# Patient Record
Sex: Male | Born: 1985 | Race: Black or African American | Hispanic: No | Marital: Single | Smoking: Never smoker
Health system: Southern US, Community
[De-identification: ages and names within clinical notes are randomized; demographics above are authoritative.]

---

## 2016-07-12 ENCOUNTER — Emergency Department: Payer: BC Managed Care – PPO

## 2016-07-12 ENCOUNTER — Encounter: Payer: Self-pay | Admitting: Emergency Medicine

## 2016-07-12 ENCOUNTER — Emergency Department
Admission: EM | Admit: 2016-07-12 | Discharge: 2016-07-12 | Disposition: A | Discharge status: Home / Self Care | Payer: BC Managed Care – PPO | Attending: Emergency Medicine | Admitting: Emergency Medicine

## 2016-07-12 DIAGNOSIS — Y939 Activity, unspecified: Secondary | ICD-10-CM | POA: Diagnosis not present

## 2016-07-12 DIAGNOSIS — S63114A Dislocation of metacarpophalangeal joint of right thumb, initial encounter: Secondary | ICD-10-CM | POA: Insufficient documentation

## 2016-07-12 DIAGNOSIS — Y929 Unspecified place or not applicable: Secondary | ICD-10-CM | POA: Diagnosis not present

## 2016-07-12 DIAGNOSIS — W010XXA Fall on same level from slipping, tripping and stumbling without subsequent striking against object, initial encounter: Secondary | ICD-10-CM | POA: Insufficient documentation

## 2016-07-12 DIAGNOSIS — S63104A Unspecified dislocation of right thumb, initial encounter: Secondary | ICD-10-CM

## 2016-07-12 DIAGNOSIS — Y999 Unspecified external cause status: Secondary | ICD-10-CM | POA: Insufficient documentation

## 2016-07-12 DIAGNOSIS — S6991XA Unspecified injury of right wrist, hand and finger(s), initial encounter: Secondary | ICD-10-CM | POA: Diagnosis present

## 2016-07-12 MED ORDER — LIDOCAINE HCL (PF) 1 % IJ SOLN
INTRAMUSCULAR | Status: AC
  Filled 2016-07-12: qty 20

## 2016-07-12 MED ORDER — OXYCODONE-ACETAMINOPHEN 5-325 MG PO TABS
2.0000 | ORAL_TABLET | Freq: Once | ORAL | Status: AC
  Administered 2016-07-12: 2 via ORAL
  Filled 2016-07-12: qty 2

## 2016-07-12 MED ORDER — TRAMADOL HCL 50 MG PO TABS: 50.0000 mg | ORAL_TABLET | Freq: Four times a day (QID) | ORAL | 0 refills | Status: AC | PRN

## 2016-07-12 MED ORDER — IBUPROFEN 800 MG PO TABS: 800.0000 mg | ORAL_TABLET | Freq: Three times a day (TID) | ORAL | 0 refills | Status: AC | PRN

## 2016-07-12 NOTE — Op Note (Signed)
   9:01 PM  PATIENT:  Cameron MetzLarry Miller  31 y.o. male  PRE-OPERATIVE DIAGNOSIS:  * No surgery found *dorsal dislocation, right thumb MCP joint  POST-OPERATIVE DIAGNOSIS:  * No surgery found *same  PROCEDURE:  * No surgery found *closed reduction right thumb MCP  SURGEON: Leitha SchullerMichael J Diora Bellizzi, MD  ASSISTANTS: None  ANESTHESIA:   local  EBL:  No intake/output data recorded.  BLOOD ADMINISTERED:none  DRAINS: none   LOCAL MEDICATIONS USED:  XYLOCAINE   SPECIMEN:  No Specimen  DISPOSITION OF SPECIMEN:  N/A  COUNTS:  NO closed procedure no count required  TOURNIQUET:  * No surgery found *  IMPLANTS: none  DICTATION: .Dragon Dictation patient was seen in emergency room with dorsal dislocation of the thumb at the MCP joint. A local digital and joint block were given with 1% Xylocaine 20 cc. Hyperextension of the thumb dorsal pressure on the proximal phalanx and pressure is applied to get the proximal phalanx over the metacarpal head and into a reduced position. Following this a x-ray was taken showing reduction area is placed in a thumb spica splint and will follow up in 1 week  PLAN OF CARE: discharge home from ER  PATIENT DISPOSITION:  stable

## 2016-07-12 NOTE — Discharge Instructions (Signed)
Wear splint until evaluation by orthopedic doctor one week.

## 2016-07-12 NOTE — ED Notes (Addendum)
See triage note  States he tripped today  Landed on wrist   Having pain with min swelling to right wrist and thumb Positive pulses

## 2016-07-12 NOTE — ED Triage Notes (Signed)
Pt states he tripped and fell tonight and landed on his right wrist. Swelling noted.

## 2016-07-12 NOTE — ED Provider Notes (Signed)
[facility] Emergency Department Provider Note   ____________________________________________   None    (approximate)  I have reviewed the triage vital signs and the nursing notes.   HISTORY  Chief Complaint Wrist Pain    HPI Cameron Miller is a 31 y.o. male is complaining of pain to the right thumb secondary to a trip and fall prior to arrival.Patient states there is some mild edema to the right thumb. Patient rates his pain as a 7/10. Patient described a pain as "achy". No palliative measures prior to arrival. Patient is right-hand dominant.   History reviewed. No pertinent past medical history.  There are no active problems to display for this patient.   History reviewed. No pertinent surgical history.  Prior to Admission medications   Medication Sig Start Date End Date Taking? Authorizing Provider  ibuprofen (ADVIL,MOTRIN) 800 MG tablet Take 1 tablet (800 mg total) by mouth every 8 (eight) hours as needed for moderate pain. 07/12/16   Joni Reiningonald K Smith, PA-C  traMADol (ULTRAM) 50 MG tablet Take 1 tablet (50 mg total) by mouth every 6 (six) hours as needed. 07/12/16 07/12/17  Joni Reiningonald K Smith, PA-C    Allergies Patient has no known allergies.  History reviewed. No pertinent family history.  Social History Social History  Substance Use Topics  . Smoking status: Never Smoker  . Smokeless tobacco: Never Used  . Alcohol use Not on file     Review of Systems Constitutional: No fever/chills Eyes: No visual changes. ENT: No sore throat. Cardiovascular: Denies chest pain. Respiratory: Denies shortness of breath. Gastrointestinal: No abdominal pain.  No nausea, no vomiting.  No diarrhea.  No constipation. Genitourinary: Negative for dysuria. Musculoskeletal: Right thumb pain Skin: Negative for rash. Neurological: Negative for headaches, focal weakness or numbness. ____________________________________________   PHYSICAL EXAM:  VITAL  SIGNS: ED Triage Vitals [07/12/16 1835]  Enc Vitals Group     BP (!) 157/88     Pulse Rate 83     Resp 18     Temp 98.4 F (36.9 C)     Temp Source Oral     SpO2 99 %     Weight      Height      Head Circumference      Peak Flow      Pain Score 7     Pain Loc      Pain Edu?      Excl. in GC?     Constitutional: Alert and oriented. Well appearing and in no acute distress. Eyes: Conjunctivae are normal. PERRL. EOMI. Head: Atraumatic. Nose: No congestion/rhinnorhea. Mouth/Throat: Mucous membranes are moist.  Oropharynx non-erythematous. Neck: No stridor.  No cervical spine tenderness to palpation. Hematological/Lymphatic/Immunilogical: No cervical lymphadenopathy. Cardiovascular: Normal rate, regular rhythm. Grossly normal heart sounds.  Good peripheral circulation. Elevated blood pressure Respiratory: Normal respiratory effort.  No retractions. Lungs CTAB. Gastrointestinal: Soft and nontender. No distention. No abdominal bruits. No CVA tenderness. Musculoskeletal: Also dislocation of the first proximal phalange . Neurologic:  Normal speech and language. No gross focal neurologic deficits are appreciated. No gait instability. Skin:  Skin is warm, dry and intact. No rash noted. Psychiatric: Mood and affect are normal. Speech and behavior are normal.  ____________________________________________   LABS (all labs ordered are listed, but only abnormal results are displayed)  Labs Reviewed - No data to display ____________________________________________  EKG   ____________________________________________  RADIOLOGY  X-rays reveals dorsal Location of the first proximal phalange a relative to the  metacarpal head. There is also a mild comminuted displaced fracture of the trapezium. ____________________________________________   PROCEDURES  Procedure(s) performed: None  Procedures  Critical Care performed:  No  ____________________________________________   INITIAL IMPRESSION / ASSESSMENT AND PLAN / ED COURSE  Pertinent labs & imaging results that were available during my care of the patient were reviewed by me and considered in my medical decision making (see chart for details).  Dorsal subluxation of the proximal phalange first digit right hand. I was not able to reduce the subluxation. Consulted orthopedic surgeon who will come and evaluate and treat the patient. Dislocation was reduced by orthopedic prior to departure. Patient advised follow orthopedics in one week. Patient given work restrictions. Patient given a prescription for tramadol and ibuprofen.      ____________________________________________   FINAL CLINICAL IMPRESSION(S) / ED DIAGNOSES  Final diagnoses:  Thumb dislocation, right, initial encounter      NEW MEDICATIONS STARTED DURING THIS VISIT:  New Prescriptions   IBUPROFEN (ADVIL,MOTRIN) 800 MG TABLET    Take 1 tablet (800 mg total) by mouth every 8 (eight) hours as needed for moderate pain.   TRAMADOL (ULTRAM) 50 MG TABLET    Take 1 tablet (50 mg total) by mouth every 6 (six) hours as needed.     Note:  This document was prepared using Dragon voice recognition software and may include unintentional dictation errors.    Joni Reining, PA-C 07/12/16 2105    Jeanmarie Plant, MD 07/12/16 2110

## 2017-07-30 IMAGING — DX DG FINGER THUMB 2+V*R*
3 series · 3 of 3 positions shown · non-contrast
Comparison: Film from earlier in the same day

CLINICAL DATA: Status post reduction

EXAM:
RIGHT THUMB 2+V

[finger ap]
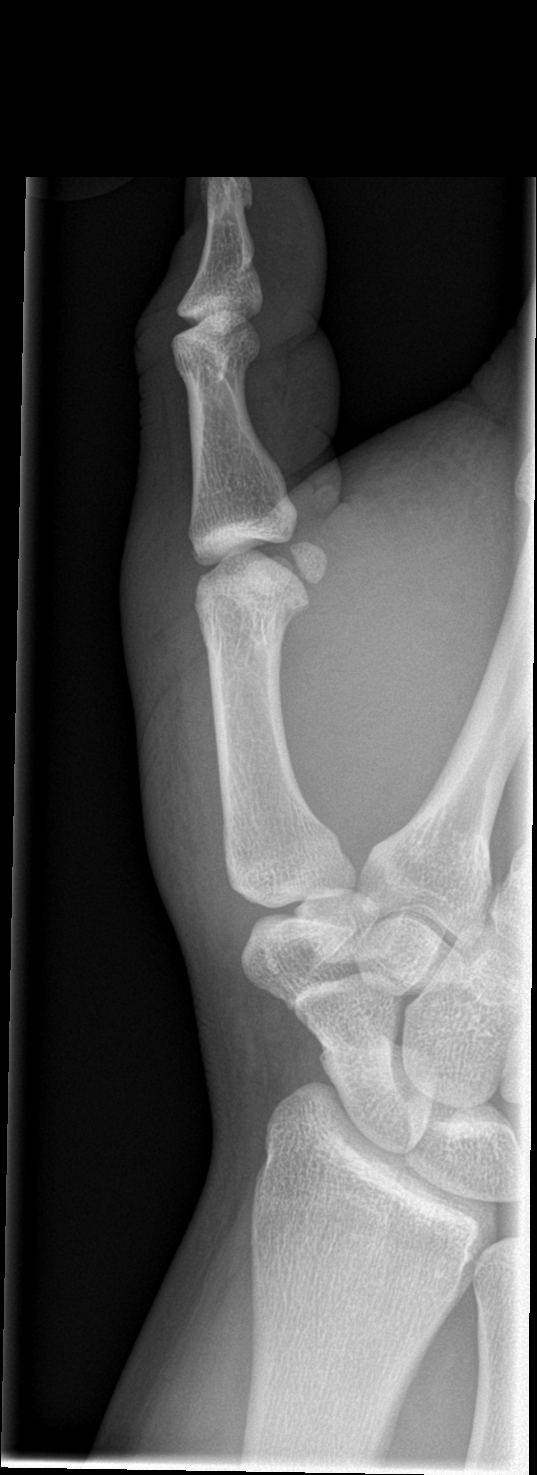

[finger obl]
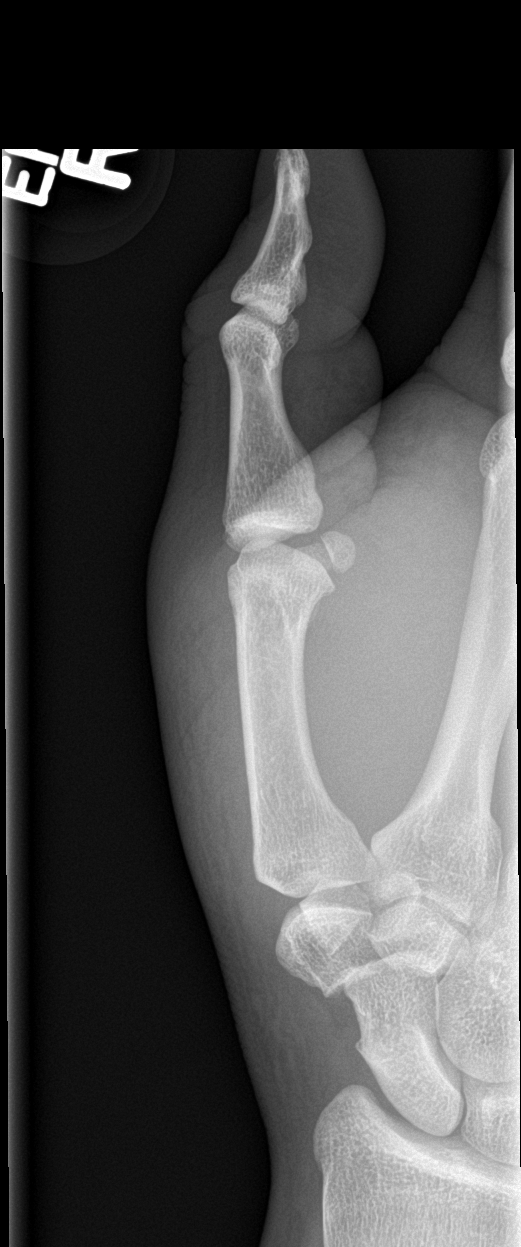

[finger lat]
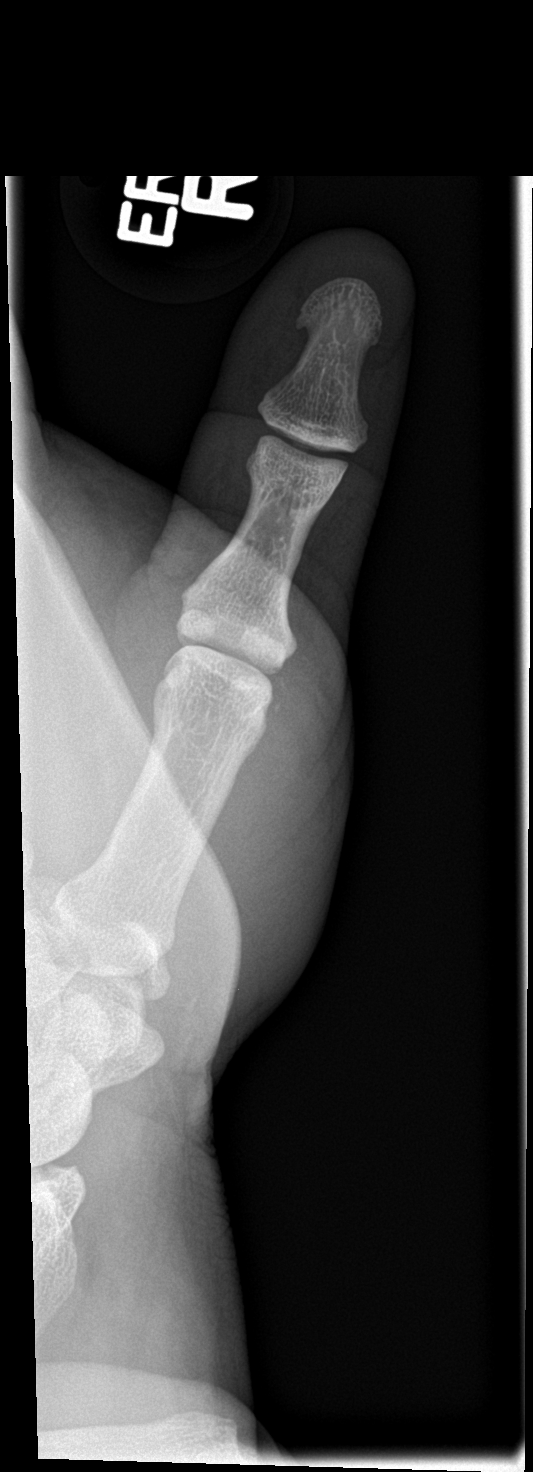

[3 of 3 positions shown; findings below may reference images not displayed]

FINDINGS: There is been reduction of the MCP joint of the first digit. Small
bony densities are noted which may represent tiny avulsion
fractures. These are stable from the prior exam. The trapezium
fracture is less well visualized on the current exam.
IMPRESSION: Reduction of the first MCP joint dislocation. Findings of small
avulsion are suggested.

The trapezium fracture is less well visualized on the current exam.

## 2017-07-30 IMAGING — DX DG FINGER THUMB 2+V*R*
3 series · 3 of 3 positions shown · non-contrast
Comparison: None.

CLINICAL DATA: Status post fall.  Thumb pain.

EXAM:
RIGHT THUMB 2+V

[finger ap]
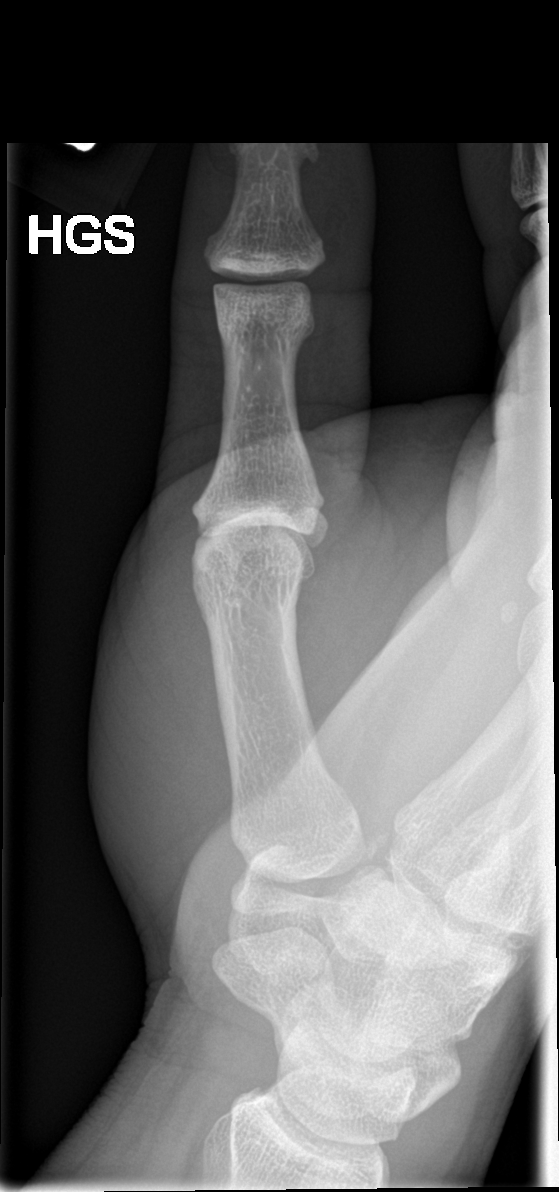

[finger obl]
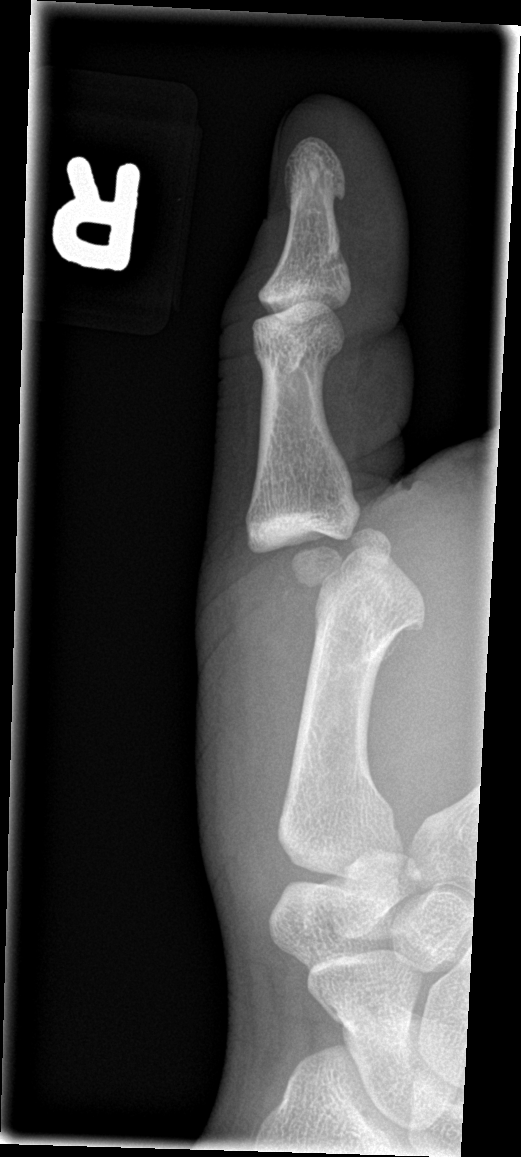

[finger lat]
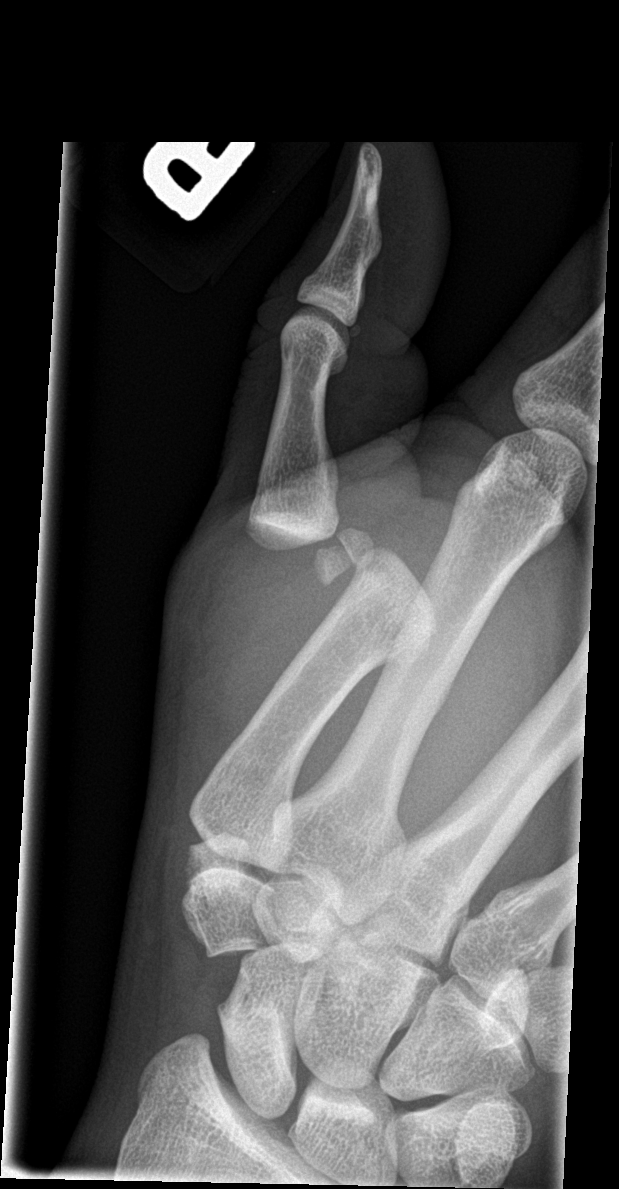

[3 of 3 positions shown; findings below may reference images not displayed]

FINDINGS: There is dorsal dislocation of the first proximal phalanx relative
to the metacarpal head. There is a mildly comminuted displaced
fracture of the trapezium. There is no evidence of arthropathy or
other focal bone abnormality. Soft tissues are unremarkable
IMPRESSION: Dorsal dislocation of the first proximal phalanx relative to the
metacarpal head.

Mildly comminuted displaced fracture of the trapezium.
